# Patient Record
Sex: Male | Born: 2000 | Race: White | Hispanic: Yes | Marital: Single | Smoking: Never smoker
Health system: Southern US, Community
[De-identification: ages and names within clinical notes are randomized; demographics above are authoritative.]

---
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
## 2022-11-06 ENCOUNTER — Encounter (HOSPITAL_COMMUNITY): Payer: Self-pay

## 2022-11-06 ENCOUNTER — Emergency Department (HOSPITAL_COMMUNITY): Payer: Medicaid Other

## 2022-11-06 ENCOUNTER — Other Ambulatory Visit: Payer: Self-pay

## 2022-11-06 ENCOUNTER — Emergency Department (HOSPITAL_COMMUNITY)
Admission: EM | Admit: 2022-11-06 | Discharge: 2022-11-06 | Disposition: A | Discharge status: Home / Self Care | Payer: Medicaid Other | Attending: Emergency Medicine | Admitting: Emergency Medicine

## 2022-11-06 DIAGNOSIS — W108XXA Fall (on) (from) other stairs and steps, initial encounter: Secondary | ICD-10-CM | POA: Insufficient documentation

## 2022-11-06 DIAGNOSIS — S01412A Laceration without foreign body of left cheek and temporomandibular area, initial encounter: Secondary | ICD-10-CM | POA: Insufficient documentation

## 2022-11-06 DIAGNOSIS — Z23 Encounter for immunization: Secondary | ICD-10-CM | POA: Diagnosis not present

## 2022-11-06 DIAGNOSIS — S0993XA Unspecified injury of face, initial encounter: Secondary | ICD-10-CM | POA: Diagnosis present

## 2022-11-06 DIAGNOSIS — S0240DA Maxillary fracture, left side, initial encounter for closed fracture: Secondary | ICD-10-CM | POA: Insufficient documentation

## 2022-11-06 DIAGNOSIS — S0181XA Laceration without foreign body of other part of head, initial encounter: Secondary | ICD-10-CM

## 2022-11-06 MED ORDER — LIDOCAINE HCL (PF) 1 % IJ SOLN
10.0000 mL | Freq: Once | INTRAMUSCULAR | Status: AC
  Administered 2022-11-06: 10 mL
  Filled 2022-11-06: qty 30

## 2022-11-06 MED ORDER — OXYCODONE-ACETAMINOPHEN 5-325 MG PO TABS: 1.0000 | ORAL_TABLET | Freq: Four times a day (QID) | ORAL | 0 refills | Status: DC | PRN

## 2022-11-06 MED ORDER — AMOXICILLIN-POT CLAVULANATE 875-125 MG PO TABS: 1.0000 | ORAL_TABLET | Freq: Two times a day (BID) | ORAL | 0 refills | Status: DC

## 2022-11-06 MED ORDER — TETANUS-DIPHTH-ACELL PERTUSSIS 5-2.5-18.5 LF-MCG/0.5 IM SUSY
0.5000 mL | PREFILLED_SYRINGE | Freq: Once | INTRAMUSCULAR | Status: AC
  Administered 2022-11-06: 0.5 mL via INTRAMUSCULAR
  Filled 2022-11-06: qty 0.5

## 2022-11-06 NOTE — ED Provider Notes (Signed)
[facility]-EMERGENCY DEPT Provider Note   CSN: [id number] Arrival date & time: 11/06/22  1818     History  Chief Complaint  Patient presents with   Facial Laceration    Jacob Mcclure is a 21 y.o. male.  HPI  Patient presents due to facial injury.  Patient states he was walking, he tripped and fell forward hitting his face against a stair.  He has a laceration to his right cheek.  Feels he is having pain to his upper jaw.  He did not lose consciousness, no vomiting, neck pain.  Was not intoxicated.  Not on blood thinners.  Unsure last tetanus.    Home Medications Prior to Admission medications   Medication Sig Start Date End Date Taking? Authorizing Provider  amoxicillin-clavulanate (AUGMENTIN) 875-125 MG tablet Take 1 tablet by mouth every 12 (twelve) hours. 11/06/22  Yes Theron Arista, PA-C  oxyCODONE-acetaminophen (PERCOCET/ROXICET) 5-325 MG tablet Take 1 tablet by mouth every 6 (six) hours as needed for severe pain. 11/06/22  Yes Theron Arista, PA-C      Allergies    Patient has no known allergies.    Review of Systems   Review of Systems  Physical Exam Updated Vital Signs BP (!) 150/96 (BP Location: Right Arm)   Pulse 61   Temp 98.4 F (36.9 C) (Oral)   Resp 18   Ht 5\' 8"  (1.727 m)   Wt 72.6 kg   SpO2 100%   BMI 24.33 kg/m  Physical Exam Vitals and nursing note reviewed. Exam conducted with a chaperone present.  Constitutional:      Appearance: Normal appearance.  HENT:     Head: Normocephalic.     Comments: 5 cm laceration left maxillaryregion.  No periorbital ecchymosis, Battle sign, septal hematoma, nasal crepitus.  I do not appreciate malocclusion although subjectively patient does discomfort with palpation left jaw. Eyes:     General: No scleral icterus.       Right eye: No discharge.        Left eye: No discharge.     Extraocular Movements: Extraocular movements intact.     Pupils: Pupils are equal, round, and reactive to light.      Comments: EOMI, reactive.  Cardiovascular:     Rate and Rhythm: Normal rate and regular rhythm.     Pulses: Normal pulses.     Heart sounds: Normal heart sounds. No murmur heard.    No friction rub. No gallop.  Pulmonary:     Effort: Pulmonary effort is normal. No respiratory distress.     Breath sounds: Normal breath sounds.  Abdominal:     General: Abdomen is flat. Bowel sounds are normal. There is no distension.     Palpations: Abdomen is soft.     Tenderness: There is no abdominal tenderness.  Musculoskeletal:     Cervical back: Normal range of motion. No rigidity or tenderness.  Skin:    General: Skin is warm and dry.     Coloration: Skin is not jaundiced.  Neurological:     Mental Status: He is alert. Mental status is at baseline.     Coordination: Coordination normal.     Comments: Cranial nerves II 12 grossly intact right upper and lower extremity strength symmetric bilaterally.     ED Results / Procedures / Treatments   Labs (all labs ordered are listed, but only abnormal results are displayed) Labs Reviewed - No data to display  EKG None  Radiology CT Maxillofacial Wo Contrast  Result Date: 11/06/2022 CLINICAL DATA:  Fall, left facial trauma EXAM: CT MAXILLOFACIAL WITHOUT CONTRAST TECHNIQUE: Multidetector CT imaging of the maxillofacial structures was performed. Multiplanar CT image reconstructions were also generated. RADIATION DOSE REDUCTION: This exam was performed according to the departmental dose-optimization program which includes automated exposure control, adjustment of the mA and/or kV according to patient size and/or use of iterative reconstruction technique. COMPARISON:  None Available. FINDINGS: Osseous: Multiple, comminuted left maxillary sinus fractures, including the anterior wall (sagittal image 62) and lateral wall (coronal image 37). There is mild disruption of the posterior wall (series 4/image 37) and mild disruption interiorly extending to the  lateral aspect of the left upper 1st molar (series 4/image 49). No additional fracture is seen. Nasal bones are intact. Mandible is intact. Bilateral mandibular condyles are well-seated in the TMJs. Orbits: The bilateral orbits, including the globes and retroconal soft tissues, are within normal limits. Sinuses: Layering fluid/hemorrhage in the left maxillary sinus. Visualized paranasal sinuses mastoid air cells are otherwise clear. Soft tissues: Mild soft tissue swelling overlying the left maxilla with a soft tissue laceration along the left lateral cheek (series 3/image 51). Limited intracranial: No significant or unexpected finding. IMPRESSION: Multiple comminuted left maxillary sinus fractures, as above. Electronically Signed   By: Charline Bills M.D.   On: 11/06/2022 19:24    Procedures .Marland KitchenLaceration Repair  Date/Time: 11/06/2022 8:01 PM  Performed by: Theron Arista, PA-C Authorized by: Theron Arista, PA-C   Consent:    Consent obtained:  Verbal   Consent given by:  Patient   Risks, benefits, and alternatives were discussed: yes     Risks discussed:  Infection, pain, need for additional repair and nerve damage   Alternatives discussed:  No treatment, delayed treatment, observation and referral Universal protocol:    Procedure explained and questions answered to patient or proxy's satisfaction: yes     Relevant documents present and verified: yes     Test results available: yes     Imaging studies available: yes     Required blood products, implants, devices, and special equipment available: yes     Site/side marked: yes     Immediately prior to procedure, a time out was called: yes     Patient identity confirmed:  Verbally with patient and arm band Anesthesia:    Anesthesia method:  Local infiltration   Local anesthetic:  Lidocaine 1% w/o epi Laceration details:    Location:  Face   Face location:  L cheek   Length (cm):  5   Depth (mm):  4 Pre-procedure details:    Preparation:   Patient was prepped and draped in usual sterile fashion Exploration:    Limited defect created (wound extended): no     Hemostasis achieved with:  Direct pressure   Wound exploration: wound explored through full range of motion and entire depth of wound visualized     Contaminated: no   Treatment:    Area cleansed with:  Povidone-iodine   Amount of cleaning:  Standard   Irrigation solution:  Sterile saline   Irrigation volume:  250   Irrigation method:  Pressure wash   Visualized foreign bodies/material removed: no   Skin repair:    Repair method:  Sutures   Suture size:  6-0   Suture material:  Nylon   Suture technique:  Simple interrupted   Number of sutures:  10 Approximation:    Approximation:  Close Repair type:    Repair type:  Simple Post-procedure details:  Dressing:  Open (no dressing)   Procedure completion:  Tolerated well, no immediate complications     Medications Ordered in ED Medications  lidocaine (PF) (XYLOCAINE) 1 % injection 10 mL (10 mLs Infiltration Given by Other 11/06/22 1945)  Tdap (BOOSTRIX) injection 0.5 mL (0.5 mLs Intramuscular Given 11/06/22 1841)    ED Course/ Medical Decision Making/ A&P                           Medical Decision Making Amount and/or Complexity of Data Reviewed Radiology: ordered.  Risk Prescription drug management.   Patient denies due to head injury.  Patient has a laceration over her left axilla, no signs of basilar skull fracture on exam.  No cervical spine tenderness, complete ROM without palpable step-offs or crepitus.  Also no focal deficits on neuroexam.  Patient [place] head CT rules and cervical spine Nexus criteria.  No indication for CT of his head and neck.  He does have a significant laceration and tenderness over the maxilla  will order Noncon CT maxillofacial to assess for facial trauma.  Tetanus was also updated.  I ordered, viewed and interpreted imaging study.  Patient has multiple fractures to the  left maxillary sinus.  EOMI, no septal hematoma.  Do not think emergent ENT consult is indicated.  Discussed with my attending who is in agreement plan.  Will start on antibiotics and provide ENT outpatient follow-up information.  Return precautions were discussed with patient at length as well as wound care.  Stable for outpatient follow-up at this time..         Final Clinical Impression(s) / ED Diagnoses Final diagnoses:  Facial laceration, initial encounter  Closed fracture of left side of maxilla, initial encounter [facility])    Rx / DC Orders ED Discharge Orders          Ordered    amoxicillin-clavulanate (AUGMENTIN) 875-125 MG tablet  Every 12 hours        11/06/22 2018    oxyCODONE-acetaminophen (PERCOCET/ROXICET) 5-325 MG tablet  Every 6 hours PRN        11/06/22 2018              Theron Arista, PA-C 11/06/22 2140    Charlynne Pander, MD 11/06/22 2306

## 2022-11-06 NOTE — ED Triage Notes (Signed)
Patient states he fell on some stairs. Patient has a laceration to the right face.

## 2022-11-06 NOTE — ED Notes (Signed)
April, paramedic notified that the patient needs  vital signs 

## 2022-11-06 NOTE — Discharge Instructions (Addendum)
You are seen today in the emergency department due to head injury.  You have a laceration to your face, this should heal in about 7 to 10 days.  Have recommended by the primary, urgent care or using the back to the ED to have it removed.  If you have fevers, spreading redness, pus that is abnormal no signs of infection to return to the ED.  Tetanus updated today, this is good for 10 years.  Follow-up with ear nose and throat next week for reevaluation, you had multiple fractures to your left maxillary sinus.  Return to the ED if you have vision change, inability to move your eyes or new or concerning symptoms.    Narrative  CLINICAL DATA:  Fall, left facial trauma    EXAM:  CT MAXILLOFACIAL WITHOUT CONTRAST    TECHNIQUE:  Multidetector CT imaging of the maxillofacial structures was  performed. Multiplanar CT image reconstructions were also generated.    RADIATION DOSE REDUCTION: This exam was performed according to the  departmental dose-optimization program which includes automated  exposure control, adjustment of the mA and/or kV according to  patient size and/or use of iterative reconstruction technique.    COMPARISON:  None Available.    FINDINGS:  Osseous: Multiple, comminuted left maxillary sinus fractures,  including the anterior wall (sagittal image 62) and lateral wall  (coronal image 37). There is mild disruption of the posterior wall  (series 4/image 37) and mild disruption interiorly extending to the  lateral aspect of the left upper 1st molar (series 4/image 49).    No additional fracture is seen. Nasal bones are intact. Mandible is  intact. Bilateral mandibular condyles are well-seated in the TMJs.    Orbits: The bilateral orbits, including the globes and retroconal  soft tissues, are within normal limits.    Sinuses: Layering fluid/hemorrhage in the left maxillary sinus.  Visualized paranasal sinuses mastoid air cells are otherwise clear.    Soft tissues: Mild soft  tissue swelling overlying the left maxilla  with a soft tissue laceration along the left lateral cheek (series  3/image 51).    Limited intracranial: No significant or unexpected finding.    IMPRESSION:  Multiple comminuted left maxillary sinus fractures, as above.      Electronically Signed    By: Charline Bills M.D.    On: 11/06/2022 19:24

## 2022-11-16 ENCOUNTER — Inpatient Hospital Stay: Payer: Medicaid Other | Admitting: Nurse Practitioner

## 2022-11-16 ENCOUNTER — Emergency Department (HOSPITAL_COMMUNITY)
Admission: EM | Admit: 2022-11-16 | Discharge: 2022-11-16 | Disposition: A | Discharge status: Home / Self Care | Payer: Medicaid Other | Attending: Emergency Medicine | Admitting: Emergency Medicine

## 2022-11-16 ENCOUNTER — Other Ambulatory Visit: Payer: Self-pay

## 2022-11-16 DIAGNOSIS — Z4802 Encounter for removal of sutures: Secondary | ICD-10-CM | POA: Insufficient documentation

## 2022-11-16 NOTE — ED Provider Notes (Signed)
[facility]-EMERGENCY DEPT Provider Note   CSN: [id number] Arrival date & time: 11/16/22  1023     History  Chief Complaint  Patient presents with   Suture / Staple Removal    Jacob Mcclure is a 21 y.o. male.   Suture / Staple Removal   21 year old male presents emergency department for suture removal.  Patient with wound on left facial cheek occurred approximately 10 days ago with 10 sutures placed.  Patient states he has had no complications with affected wound and is here for suture removal.  Denies drainage from site, redness, fever.  Home Medications Prior to Admission medications   Medication Sig Start Date End Date Taking? Authorizing Provider  amoxicillin-clavulanate (AUGMENTIN) 875-125 MG tablet Take 1 tablet by mouth every 12 (twelve) hours. 11/06/22   Theron Arista, PA-C  oxyCODONE-acetaminophen (PERCOCET/ROXICET) 5-325 MG tablet Take 1 tablet by mouth every 6 (six) hours as needed for severe pain. 11/06/22   Theron Arista, PA-C      Allergies    Patient has no known allergies.    Review of Systems   Review of Systems  All other systems reviewed and are negative.   Physical Exam Updated Vital Signs BP (!) 132/53 (BP Location: Left Arm)   Pulse 79   Temp 98.4 F (36.9 C) (Oral)   Resp 16   Ht 5\' 8"  (1.727 m)   Wt 72.6 kg   SpO2 95%   BMI 24.33 kg/m  Physical Exam Vitals and nursing note reviewed.  Constitutional:      General: He is not in acute distress.    Appearance: He is well-developed.  HENT:     Head: Normocephalic and atraumatic.  Eyes:     Conjunctiva/sclera: Conjunctivae normal.  Cardiovascular:     Rate and Rhythm: Normal rate and regular rhythm.     Heart sounds: No murmur heard. Pulmonary:     Effort: Pulmonary effort is normal. No respiratory distress.     Breath sounds: Normal breath sounds.  Abdominal:     Palpations: Abdomen is soft.     Tenderness: There is no abdominal tenderness.  Musculoskeletal:         General: No swelling.     Cervical back: Neck supple.  Skin:    General: Skin is warm and dry.     Capillary Refill: Capillary refill takes less than 2 seconds.     Comments: Prior laceration well-approximated with no obvious erythema, palpable fluctuance or drainage appreciated.  10 sutures in place.  Neurological:     Mental Status: He is alert.  Psychiatric:        Mood and Affect: Mood normal.     ED Results / Procedures / Treatments   Labs (all labs ordered are listed, but only abnormal results are displayed) Labs Reviewed - No data to display  EKG None  Radiology No results found.  Procedures .Suture Removal  Date/Time: 11/16/2022 11:33 AM  Performed by: 11/18/2022, PA Authorized by: Peter Garter, PA   Consent:    Consent obtained:  Verbal   Consent given by:  Patient   Risks, benefits, and alternatives were discussed: yes     Risks discussed:  Bleeding, pain and wound separation   Alternatives discussed:  No treatment, delayed treatment, alternative treatment and observation Universal protocol:    Procedure explained and questions answered to patient or proxy's satisfaction: yes     Patient identity confirmed:  Verbally with patient Location:  Location:  [place] location:  [place] location:  L cheek Procedure details:    Wound appearance:  No signs of infection, good wound healing and clean   Number of sutures removed:  10 Post-procedure details:    Procedure completion:  Tolerated well, no immediate complications     Medications Ordered in ED Medications - No data to display  ED Course/ Medical Decision Making/ A&P                           Medical Decision Making  This patient presents to the ED for concern of suture removal, this involves an extensive number of treatment options, and is a complaint that carries with it a high risk of complications and morbidity.  The differential diagnosis includes suture  removal   Co morbidities that complicate the patient evaluation  See HPI   Additional history obtained:  Additional history obtained from EMR External records from outside source obtained and reviewed including hospital records   Lab Tests:  N/a   Imaging Studies ordered:  N/a   Cardiac Monitoring: / EKG:  The patient was maintained on a cardiac monitor.  I personally viewed and interpreted the cardiac monitored which showed an underlying rhythm of: Sinus rhythm   Consultations Obtained:  N/a   Problem List / ED Course / Critical interventions / Medication management  Suture removal Reevaluation of the patient showed that the patient improved I have reviewed the patients home medicines and have made adjustments as needed   Social Determinants of Health:  Denies tobacco, licit drug use   Test / Admission - Considered:  Suture removal Vitals signs within normal range and stable throughout visit. Patient presents emergency department with overall well-appearing and well-healing wound.  Sutures removed as indicated above.  Patient educated regarding proper care after suture removal.  Follow-up recommended for reevaluation by primary care.  Treatment plan discussed at length with patient and he acknowledged understanding was agreeable to said plan. Worrisome signs and symptoms were discussed with the patient, and the patient acknowledged understanding to return to the ED if noticed. Patient was stable upon discharge.          Final Clinical Impression(s) / ED Diagnoses Final diagnoses:  Visit for suture removal    Rx / DC Orders ED Discharge Orders     None         Wilnette Kales, [place] 11/16/22 1135    Fransico Meadow, MD 11/24/22 [phone]

## 2022-11-16 NOTE — ED Triage Notes (Signed)
Pt via POV for suture removal to wound on left face.

## 2022-11-16 NOTE — Discharge Instructions (Addendum)
Note the visit emergency department today was overall reassuring.  Make sure to continue to hydrate area where scar is present with Vaseline/lotions to help decrease likelihood of noticed ability of scar.  See information attached to discharge papers for further information regarding suture removal aftercare.  Please do not hesitate to return to emergency department for worrisome signs and symptoms we discussed become apparent.

## 2023-09-19 ENCOUNTER — Encounter (HOSPITAL_COMMUNITY): Payer: Self-pay

## 2023-09-19 ENCOUNTER — Ambulatory Visit (HOSPITAL_COMMUNITY)
Admission: EM | Admit: 2023-09-19 | Discharge: 2023-09-19 | Disposition: A | Discharge status: Home / Self Care | Payer: Medicaid Other | Attending: Family Medicine | Admitting: Family Medicine

## 2023-09-19 DIAGNOSIS — L0100 Impetigo, unspecified: Secondary | ICD-10-CM

## 2023-09-19 MED ORDER — MUPIROCIN 2 % EX OINT: 1.0000 | TOPICAL_OINTMENT | Freq: Two times a day (BID) | CUTANEOUS | 0 refills | Status: AC

## 2023-09-19 MED ORDER — CEPHALEXIN 500 MG PO CAPS: 500.0000 mg | ORAL_CAPSULE | Freq: Three times a day (TID) | ORAL | 0 refills | Status: AC

## 2023-09-19 NOTE — ED Triage Notes (Signed)
Patient reports that he began having a rash to his scalp then it progressed to his eyebrow and now his arms x 1 month. Rash is round in nature.  Patient states he has been taking ringworm cream from [facility]..

## 2023-09-19 NOTE — Discharge Instructions (Signed)
Take cephalexin 500 mg--1 capsule 3 times daily for 7 days  Put mupirocin ointment on the sore areas twice daily until improved

## 2023-09-19 NOTE — ED Provider Notes (Signed)
[facility]    CSN: [id number] Arrival date & time: 09/19/23  1553      History   Chief Complaint Chief Complaint  Patient presents with   Rash    HPI Jacob Mcclure is a 22 y.o. male.    Rash Here for rash spots that first been noticed on his scalp and then he has noted them later on his eyebrow and now on his arms.  They are itchy and they have had a golden crust sometimes.  He has been putting treatment cream for ringworm and that has not been helping.  No fever.    History reviewed. No pertinent past medical history.  There are no problems to display for this patient.   History reviewed. No pertinent surgical history.     Home Medications    Prior to Admission medications   Medication Sig Start Date End Date Taking? Authorizing Provider  cephALEXin (KEFLEX) 500 MG capsule Take 1 capsule (500 mg total) by mouth 3 (three) times daily for 7 days. 09/19/23 09/26/23 Yes Zenia Resides, MD  mupirocin ointment (BACTROBAN) 2 % Apply 1 Application topically 2 (two) times daily. To affected area till better 09/19/23  Yes Hutson Luft, Janace Aris, MD    Family History History reviewed. No pertinent family history.  Social History Social History   Tobacco Use   Smoking status: Never   Smokeless tobacco: Never  Vaping Use   Vaping status: Every Day   Substances: Nicotine, Flavoring  Substance Use Topics   Alcohol use: Not Currently   Drug use: Not Currently    Types: Marijuana     Allergies   Patient has no known allergies.   Review of Systems Review of Systems  Skin:  Positive for rash.     Physical Exam Triage Vital Signs ED Triage Vitals [09/19/23 1701]  Encounter Vitals Group     BP 137/74     Systolic BP Percentile      Diastolic BP Percentile      Pulse Rate 60     Resp 16     Temp 97.7 F (36.5 C)     Temp Source Oral     SpO2 96 %     Weight      Height      Head Circumference      Peak Flow      Pain Score       Pain Loc      Pain Education      Exclude from Growth Chart    No data found.  Updated Vital Signs BP 137/74 (BP Location: Right Arm)   Pulse 60   Temp 97.7 F (36.5 C) (Oral)   Resp 16   SpO2 96%   Visual Acuity Right Eye Distance:   Left Eye Distance:   Bilateral Distance:    Right Eye Near:   Left Eye Near:    Bilateral Near:     Physical Exam Vitals reviewed.  Constitutional:      General: He is not in acute distress.    Appearance: He is not ill-appearing, toxic-appearing or diaphoretic.  HENT:     Mouth/Throat:     Mouth: Mucous membranes are moist.  Skin:    Coloration: Skin is not pale.     Comments: There are 2-3 rash spots that are about 1.5 x 2 cm on his scalp and there are some smaller spots on his left eyebrow and on his left forearm that are about 0.5  cm to 1 cm in diameter.  A couple of them did have some golden crusting        UC Treatments / Results  Labs (all labs ordered are listed, but only abnormal results are displayed) Labs Reviewed - No data to display  EKG   Radiology No results found.  Procedures Procedures (including critical care time)  Medications Ordered in UC Medications - No data to display  Initial Impression / Assessment and Plan / UC Course  I have reviewed the triage vital signs and the nursing notes.  Pertinent labs & imaging results that were available during my care of the patient were reviewed by me and considered in my medical decision making (see chart for details).     Some of this looks like impetigo, so going to send in amoxicillin and Bactroban.   Final Clinical Impressions(s) / UC Diagnoses   Final diagnoses:  Impetigo     Discharge Instructions      Take cephalexin 500 mg--1 capsule 3 times daily for 7 days  Put mupirocin ointment on the sore areas twice daily until improved      ED Prescriptions     Medication Sig Dispense Auth. Provider   cephALEXin (KEFLEX) 500 MG capsule Take 1  capsule (500 mg total) by mouth 3 (three) times daily for 7 days. 21 capsule Zenia Resides, MD   mupirocin ointment (BACTROBAN) 2 % Apply 1 Application topically 2 (two) times daily. To affected area till better 22 g Marlinda Mike Janace Aris, MD      PDMP not reviewed this encounter.   Zenia Resides, MD 09/19/23 [phone]

## 2023-09-19 NOTE — ED Notes (Signed)
Called x1 for triage. No answer

## 2024-11-09 ENCOUNTER — Emergency Department (HOSPITAL_BASED_OUTPATIENT_CLINIC_OR_DEPARTMENT_OTHER)
Admission: EM | Admit: 2024-11-09 | Discharge: 2024-11-09 | Disposition: A | Discharge status: Home / Self Care | Attending: Emergency Medicine | Admitting: Emergency Medicine

## 2024-11-09 ENCOUNTER — Encounter (HOSPITAL_BASED_OUTPATIENT_CLINIC_OR_DEPARTMENT_OTHER): Payer: Self-pay | Admitting: Emergency Medicine

## 2024-11-09 ENCOUNTER — Other Ambulatory Visit: Payer: Self-pay

## 2024-11-09 DIAGNOSIS — H9201 Otalgia, right ear: Secondary | ICD-10-CM | POA: Diagnosis present

## 2024-11-09 DIAGNOSIS — H6121 Impacted cerumen, right ear: Secondary | ICD-10-CM | POA: Diagnosis not present

## 2024-11-09 MED ORDER — DOCUSATE SODIUM 50 MG/5ML PO LIQD
2.0000 mL | Freq: Two times a day (BID) | ORAL | Status: DC | PRN
  Administered 2024-11-09: 20 mg via OTIC
  Filled 2024-11-09: qty 10

## 2024-11-09 NOTE — ED Triage Notes (Signed)
 Pt reports RT ear pain; sts he thinks it's from water getting in ear in the shower

## 2024-11-09 NOTE — ED Notes (Addendum)
 RN visualized outer ear using otoscope.  Dark brown and black cerumen impaction noted.    RN attempted to flush pt R ear using 10 mL saline flushes attached to an 18g IV catheter.  Minor amount of cerumen noted during flushing but RN can still visualize cerumen impaction at tympanic membrane.    MD notified.    RN instilled approximately 0.5 mL Colace into ear, let dwell for 10 minutes, then attempted to flush ear.  Minor amount of cerumen noted in flush.    RN provided pt with Colace to attempt cleaning at home.

## 2024-11-09 NOTE — Discharge Instructions (Signed)
 Use Colace at home to soften ear wax and relieve impaction. Put 2-3 drops in the ear and let sit for 10-15 minutes. Then rinse gently using the syringe provided.   If symptoms do not resolve, follow up with primary care. If you do not have a primary care provider, you can go to any Urgent Care. If you develop severe pain, bleeding from the ear, fever, return to the ED.

## 2024-11-09 NOTE — ED Provider Notes (Signed)
" °  [place] [facility] Provider Note   CSN: [id number] Arrival date & time: 11/09/24  8078     Patient presents with: Otalgia   Jacob Mcclure is a 23 y.o. male.   Patient to ED with complaint of right ear clogged and uncomfortable for the past 1-2 days. No injury. No drainage or bleeding from the ear. No sore throat, fever, cough.   The history is provided by the patient. No language interpreter was used.  Otalgia      Prior to Admission medications  Medication Sig Start Date End Date Taking? Authorizing Provider  mupirocin  ointment (BACTROBAN ) 2 % Apply 1 Application topically 2 (two) times daily. To affected area till better 09/19/23   Vonna Sharlet POUR, MD    Allergies: Patient has no known allergies.    Review of Systems  HENT:  Positive for ear pain.     Updated Vital Signs BP 132/86   Pulse 61   Temp 98.1 F (36.7 C) (Oral)   Resp 18   Ht 5' 7 (1.702 m)   Wt 77.1 kg   SpO2 100%   BMI 26.63 kg/m   Physical Exam Constitutional:      Appearance: He is well-developed.  HENT:     Ears:     Comments: Cerumen impaction of the right ear. Left ear clear.  Pulmonary:     Effort: Pulmonary effort is normal.  Musculoskeletal:        General: Normal range of motion.     Cervical back: Normal range of motion.  Skin:    General: Skin is warm and dry.  Neurological:     Mental Status: He is alert and oriented to person, place, and time.     (all labs ordered are listed, but only abnormal results are displayed) Labs Reviewed - No data to display  EKG: None  Radiology: No results found.   Procedures   Medications Ordered in the ED  docusate (COLACE) 50 MG/5ML liquid 20 mg (has no administration in time range)    Clinical Course as of 11/09/24 2158  Sat Nov 09, 2024  2155 Ear lavage attempted to relieve impaction of cerumen with limited success. Patient provided 10 ml Colace to use at home with instructions for  same. Encourage PCP follow up as needed.  [SU]    Clinical Course User Index [SU] Odell Balls, PA-C                                 Medical Decision Making      Final diagnoses:  Impacted cerumen of right ear    ED Discharge Orders     None          Odell Balls, DEVONNA 11/09/24 2158  "
